# Patient Record
Sex: Female | Born: 1955 | Race: Black or African American | Hispanic: No | Marital: Married | Smoking: Former smoker
Health system: Southern US, Community
[De-identification: ages and names within clinical notes are randomized; demographics above are authoritative.]

---

## 2006-10-29 ENCOUNTER — Emergency Department: Payer: Self-pay | Admitting: Emergency Medicine

## 2007-02-20 ENCOUNTER — Encounter: Payer: Self-pay | Admitting: Orthopedic Surgery

## 2007-03-01 ENCOUNTER — Encounter: Payer: Self-pay | Admitting: Orthopedic Surgery

## 2007-12-16 ENCOUNTER — Ambulatory Visit: Payer: Self-pay | Admitting: Internal Medicine

## 2008-01-26 ENCOUNTER — Encounter: Payer: Self-pay | Admitting: Rheumatology

## 2008-01-29 ENCOUNTER — Encounter: Payer: Self-pay | Admitting: Rheumatology

## 2008-04-06 ENCOUNTER — Emergency Department: Payer: Self-pay | Admitting: Emergency Medicine

## 2008-06-14 ENCOUNTER — Ambulatory Visit: Payer: Self-pay | Admitting: Pain Medicine

## 2008-06-22 ENCOUNTER — Ambulatory Visit: Payer: Self-pay | Admitting: Pain Medicine

## 2008-06-27 ENCOUNTER — Ambulatory Visit: Payer: Self-pay | Admitting: Pain Medicine

## 2008-07-11 ENCOUNTER — Ambulatory Visit: Payer: Self-pay | Admitting: Neurology

## 2008-07-13 ENCOUNTER — Ambulatory Visit: Payer: Self-pay | Admitting: Pain Medicine

## 2008-08-09 ENCOUNTER — Ambulatory Visit: Payer: Self-pay | Admitting: Pain Medicine

## 2008-08-11 ENCOUNTER — Ambulatory Visit: Payer: Self-pay | Admitting: Neurology

## 2008-09-01 ENCOUNTER — Ambulatory Visit: Payer: Self-pay | Admitting: Pain Medicine

## 2008-11-09 ENCOUNTER — Ambulatory Visit: Payer: Self-pay | Admitting: Pain Medicine

## 2008-11-16 ENCOUNTER — Emergency Department: Payer: Self-pay | Admitting: Emergency Medicine

## 2008-12-08 ENCOUNTER — Ambulatory Visit: Payer: Self-pay | Admitting: Pain Medicine

## 2008-12-19 ENCOUNTER — Ambulatory Visit: Payer: Self-pay | Admitting: Internal Medicine

## 2009-01-10 ENCOUNTER — Ambulatory Visit: Payer: Self-pay | Admitting: Pain Medicine

## 2009-01-16 ENCOUNTER — Ambulatory Visit: Payer: Self-pay | Admitting: Pain Medicine

## 2009-02-16 ENCOUNTER — Ambulatory Visit: Payer: Self-pay | Admitting: Pain Medicine

## 2009-02-23 ENCOUNTER — Encounter: Payer: Self-pay | Admitting: Neurology

## 2009-03-01 ENCOUNTER — Encounter: Payer: Self-pay | Admitting: Neurology

## 2009-03-16 ENCOUNTER — Ambulatory Visit: Payer: Self-pay | Admitting: Pain Medicine

## 2009-03-27 ENCOUNTER — Ambulatory Visit: Payer: Self-pay | Admitting: Pain Medicine

## 2009-03-30 ENCOUNTER — Encounter: Payer: Self-pay | Admitting: Neurology

## 2009-04-19 ENCOUNTER — Ambulatory Visit: Payer: Self-pay | Admitting: Gastroenterology

## 2009-04-25 ENCOUNTER — Ambulatory Visit: Payer: Self-pay | Admitting: Pain Medicine

## 2009-05-17 ENCOUNTER — Inpatient Hospital Stay (HOSPITAL_COMMUNITY): Admission: RE | Admit: 2009-05-17 | Discharge: 2009-05-20 | Payer: Self-pay | Admitting: Neurosurgery

## 2009-05-25 ENCOUNTER — Ambulatory Visit: Payer: Self-pay | Admitting: Pain Medicine

## 2009-06-27 ENCOUNTER — Ambulatory Visit: Payer: Self-pay | Admitting: Pain Medicine

## 2009-08-03 ENCOUNTER — Ambulatory Visit: Payer: Self-pay | Admitting: Pain Medicine

## 2009-08-16 ENCOUNTER — Ambulatory Visit: Payer: Self-pay | Admitting: Pain Medicine

## 2009-09-12 ENCOUNTER — Ambulatory Visit: Payer: Self-pay | Admitting: Pediatric Dentistry

## 2009-10-19 ENCOUNTER — Ambulatory Visit: Payer: Self-pay | Admitting: Pain Medicine

## 2009-10-24 ENCOUNTER — Ambulatory Visit: Payer: Self-pay | Admitting: Unknown Physician Specialty

## 2009-11-06 ENCOUNTER — Ambulatory Visit: Payer: Self-pay | Admitting: Pain Medicine

## 2009-12-07 ENCOUNTER — Ambulatory Visit: Payer: Self-pay | Admitting: Pain Medicine

## 2009-12-18 ENCOUNTER — Ambulatory Visit: Payer: Self-pay | Admitting: Pain Medicine

## 2011-01-06 LAB — CBC
HCT: 37 % (ref 36.0–46.0)
MCV: 75.1 fL — ABNORMAL LOW (ref 78.0–100.0)
Platelets: 266 10*3/uL (ref 150–400)
RDW: 17.6 % — ABNORMAL HIGH (ref 11.5–15.5)

## 2011-01-06 LAB — BASIC METABOLIC PANEL
BUN: 10 mg/dL (ref 6–23)
CO2: 29 mEq/L (ref 19–32)
Chloride: 103 mEq/L (ref 96–112)
Glucose, Bld: 109 mg/dL — ABNORMAL HIGH (ref 70–99)
Potassium: 4.1 mEq/L (ref 3.5–5.1)

## 2011-02-12 NOTE — Op Note (Signed)
Virginia Osborn, PARSLEY NO.:  [id number]   MEDICAL RECORD NO.:  [id number]          PATIENT TYPE:  INP   LOCATION:  3036                         FACILITY:  [facility]   PHYSICIAN:  Hilda Lias, M.D.   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   PROCEDURES:  1. Anterior C3-C4, C4-C5, C5-C6 diskectomy, decompression of the      spinal cord, removal of posterior calcified ligament, foraminotomy,      and interbody fusion with autograft and allograft and plate.  2. Microscope.   SURGEON:  Hilda Lias, MD   ASSISTANT:  Dr. Lovell Sheehan.   CLINICAL HISTORY:  [name] is a lady who came to my office  complaining of neck pain with radiation to both upper extremities  associated with weakness.  The patient has failed conservative  treatment.  X-ray shows severe stenosis at the level of 3-4, 4-5, 5-6  secondary to calcified posterior ligament.  Surgery was advised, and the  risks were explained to her.   DESCRIPTION OF PROCEDURE:  The patient was taken to the OR, and after  intubation, the neck was cleaned with DuraPrep.  Transverse incision was  made through the skin and subcutaneous tissue, platysma down to the  cervical area.  We found that she had 2 large osteophytes.  X-rays  showed we were at the level of C3-C4.  From then on, with the Leksell we  removed the osteophyte.  To enter into the disk space, we had to drill  the anterior ligament.  The area between 3-4 was almost calcified.  Once  we removed the disk, we found that she had calcification to the  posterior ligament, mostly from the midline to the left side.  Using the  1 and 2 mm Kerrison punch as well  as the drill, we were able to  decompress the spinal cord and do a bilateral foraminotomy.  Essentially, the same findings were found at the level of 4-5, 5-6, with  decompression at the end of the spinal cord of the nerve root.  Then,  the end-plates were drilled, and 3 pieces of allograft with autograft  inside, BMX, were inserted.  The graft was 7 mm height and lordotic.  This was followed by plate using 8 screws.  Lateral cervical spine  showed that there was good position of the graft at the level of 3-4, 4-  5.  It was difficult to see 5-6.  Although we achieved good hemostasis,  we left a drain.  The wound was at the end closed with Vicryl and Steri-  Strips.           ______________________________  Hilda Lias, M.D.     EB/MEDQ  D:  05/17/2009  T:  05/18/2009  Job:  [id number]

## 2011-02-12 NOTE — Discharge Summary (Signed)
[name], [name] NO.:  [id number]   MEDICAL RECORD NO.:  [id number]          PATIENT TYPE:  INP   LOCATION:  3036                         FACILITY:  [facility]   PHYSICIAN:  Danae Orleans. Venetia Maxon, M.D.  DATE OF BIRTH:  [birth year]   DATE OF ADMISSION:  05/17/2009  DATE OF DISCHARGE:  05/20/2009                               DISCHARGE SUMMARY   REASON FOR ADMISSION:  Cervical spondylosis with stenosis and  ossification of posterior longitudinal ligament.   HOSPITAL COURSE:  The patient was admitted to the hospital with cervical  myelopathy.  She had bilateral upper extremity weakness, was admitted on  same day of procedure basis, and underwent anterior cervical  decompression and fusion C3-4, C4-5, and C5-6 levels.  She did well with  her surgery, was gradually mobilized, had some dysphagia which improved,  had a JP drain which was discontinued, and was up and ambulating, was  doing well on the May 20, 2009, and was given prescription for  Percocet 5/325 one every 4 hours as needed for pain, dispensed #16,  Valium 5 mg one or two every 6 hours as needed for spasm, dispensed #40.  She was instructed to follow up in the office with Dr. Jeral Fruit in 3  weeks.  She remained on her preoperative medications of gabapentin,  metronidazole, vitamin D, tramadol, clarithromycin, Darvocet, Nexium,  cyclobenzaprine, Cymbalta, Zyrtec, lisinopril, and iron.      Danae Orleans. Venetia Maxon, M.D.  Electronically Signed     JDS/MEDQ  D:  05/20/2009  T:  05/20/2009  Job:  [id number]

## 2012-09-09 ENCOUNTER — Emergency Department: Payer: Self-pay | Admitting: Internal Medicine

## 2012-09-29 ENCOUNTER — Ambulatory Visit: Payer: Self-pay | Admitting: Internal Medicine

## 2012-10-27 ENCOUNTER — Emergency Department: Payer: Self-pay | Admitting: Emergency Medicine

## 2012-10-27 LAB — CBC WITH DIFFERENTIAL/PLATELET
Basophil %: 0.9 %
Eosinophil #: 0.3 10*3/uL (ref 0.0–0.7)
HCT: 38.1 % (ref 35.0–47.0)
Lymphocyte #: 3.5 10*3/uL (ref 1.0–3.6)
Lymphocyte %: 51.9 %
MCHC: 31.9 g/dL — ABNORMAL LOW (ref 32.0–36.0)
MCV: 73 fL — ABNORMAL LOW (ref 80–100)
Monocyte #: 0.6 x10 3/mm (ref 0.2–0.9)
Monocyte %: 9.3 %
Platelet: 218 10*3/uL (ref 150–440)
RDW: 16.8 % — ABNORMAL HIGH (ref 11.5–14.5)
WBC: 6.7 10*3/uL (ref 3.6–11.0)

## 2012-10-27 LAB — COMPREHENSIVE METABOLIC PANEL
Albumin: 3.6 g/dL (ref 3.4–5.0)
Alkaline Phosphatase: 127 U/L (ref 50–136)
BUN: 10 mg/dL (ref 7–18)
Bilirubin,Total: 0.3 mg/dL (ref 0.2–1.0)
Calcium, Total: 8.9 mg/dL (ref 8.5–10.1)
Chloride: 105 mmol/L (ref 98–107)
Co2: 29 mmol/L (ref 21–32)
Creatinine: 0.98 mg/dL (ref 0.60–1.30)
EGFR (African American): >60
EGFR (Non-African Amer.): >60
Osmolality: 283 (ref 275–301)
SGPT (ALT): 31 U/L (ref 12–78)

## 2012-10-27 LAB — CK TOTAL AND CKMB (NOT AT ARMC)
CK, Total: 99 U/L (ref 21–215)
CK-MB: <0.5 ng/mL — ABNORMAL LOW (ref 0.5–3.6)

## 2012-10-30 ENCOUNTER — Emergency Department: Payer: Self-pay | Admitting: Emergency Medicine

## 2012-11-19 ENCOUNTER — Ambulatory Visit: Payer: Self-pay | Admitting: Internal Medicine

## 2014-06-28 ENCOUNTER — Other Ambulatory Visit: Payer: Self-pay | Admitting: Neurosurgery

## 2014-06-28 DIAGNOSIS — M5412 Radiculopathy, cervical region: Secondary | ICD-10-CM

## 2014-07-07 ENCOUNTER — Other Ambulatory Visit: Payer: Self-pay

## 2014-07-28 ENCOUNTER — Ambulatory Visit
Admission: RE | Admit: 2014-07-28 | Discharge: 2014-07-28 | Disposition: A | Discharge status: Home / Self Care | Payer: Medicaid Other | Source: Ambulatory Visit | Attending: Neurosurgery | Admitting: Neurosurgery

## 2014-07-28 VITALS — BP 147/54 | HR 55

## 2014-07-28 DIAGNOSIS — M5412 Radiculopathy, cervical region: Secondary | ICD-10-CM

## 2014-07-28 MED ORDER — DIAZEPAM 5 MG PO TABS
10.0000 mg | ORAL_TABLET | Freq: Once | ORAL | Status: AC
  Administered 2014-07-28: 10 mg via ORAL

## 2014-07-28 MED ORDER — ONDANSETRON 8 MG PO TBDP
8.0000 mg | ORAL_TABLET | Freq: Once | ORAL | Status: AC
  Administered 2014-07-28: 8 mg via ORAL

## 2014-07-28 MED ORDER — HYDROMORPHONE HCL 1 MG/ML IJ SOLN
1.0000 mg | Freq: Once | INTRAMUSCULAR | Status: AC
  Administered 2014-07-28: 1 mg via INTRAMUSCULAR

## 2014-07-28 MED ORDER — ONDANSETRON HCL 4 MG/2ML IJ SOLN
4.0000 mg | Freq: Once | INTRAMUSCULAR | Status: AC
  Administered 2014-07-28: 4 mg via INTRAMUSCULAR

## 2014-07-28 MED ORDER — IOHEXOL 300 MG/ML  SOLN
10.0000 mL | Freq: Once | INTRAMUSCULAR | Status: AC | PRN
  Administered 2014-07-28: 10 mL via INTRATHECAL

## 2014-07-28 NOTE — Progress Notes (Signed)
Pt had a syncopal spell when she went for her standing films. Returned via stretcher to nrsg area and vomited a very small amount of clear fluid. Pt has not eaten since last pm, gave her a few sips of coke. Pt tolerated this well. Will see if she is now able to stand for films.

## 2014-07-28 NOTE — Discharge Instructions (Signed)
Myelogram Discharge Instructions  1. Go home and rest quietly for the next 24 hours.  It is important to lie flat for the next 24 hours.  Get up only to go to the restroom.  You may lie in the bed or on a couch on your back, your stomach, your left side or your right side.  You may have one pillow under your head.  You may have pillows between your knees while you are on your side or under your knees while you are on your back.  2. DO NOT drive today.  Recline the seat as far back as it will go, while still wearing your seat belt, on the way home.  3. You may get up to go to the bathroom as needed.  You may sit up for 10 minutes to eat.  You may resume your normal diet and medications unless otherwise indicated.  Drink lots of extra fluids today and tomorrow.  4. The incidence of headache, nausea, or vomiting is about 5% (one in 20 patients).  If you develop a headache, lie flat and drink plenty of fluids until the headache goes away.  Caffeinated beverages may be helpful.  If you develop severe nausea and vomiting or a headache that does not go away with flat bed rest, call [phone].  5. You may resume normal activities after your 24 hours of bed rest is over; however, do not exert yourself strongly or do any heavy lifting tomorrow. If when you get up you have a headache when standing, go back to bed and force fluids for another 24 hours.  6. Call your physician for a follow-up appointment.  The results of your myelogram will be sent directly to your physician by the following day.  7. If you have any questions or if complications develop after you arrive home, please call [phone].  Discharge instructions have been explained to the patient.  The patient, or the person responsible for the patient, fully understands these instructions.      May resume Tramadol and Sumatriptan on Oct. 30, 2015, after 11:00 am.

## 2014-07-28 NOTE — Progress Notes (Signed)
Patient states she has been off Sumatriptan and Tramadol for at least the past two days.

## 2014-09-14 ENCOUNTER — Other Ambulatory Visit: Payer: Self-pay | Admitting: Neurosurgery

## 2014-09-14 DIAGNOSIS — M5136 Other intervertebral disc degeneration, lumbar region: Secondary | ICD-10-CM

## 2014-12-16 ENCOUNTER — Ambulatory Visit
Admission: RE | Admit: 2014-12-16 | Discharge: 2014-12-16 | Disposition: A | Discharge status: Home / Self Care | Payer: Medicaid Other | Source: Ambulatory Visit | Attending: Neurosurgery | Admitting: Neurosurgery

## 2014-12-16 DIAGNOSIS — M5136 Other intervertebral disc degeneration, lumbar region: Secondary | ICD-10-CM

## 2014-12-16 MED ORDER — METHYLPREDNISOLONE ACETATE 40 MG/ML INJ SUSP (RADIOLOG
120.0000 mg | Freq: Once | INTRAMUSCULAR | Status: AC
  Administered 2014-12-16: 120 mg via EPIDURAL

## 2014-12-16 MED ORDER — IOHEXOL 180 MG/ML  SOLN
1.0000 mL | Freq: Once | INTRAMUSCULAR | Status: AC | PRN
  Administered 2014-12-16: 1 mL via EPIDURAL

## 2014-12-16 NOTE — Discharge Instructions (Signed)

## 2015-05-02 ENCOUNTER — Other Ambulatory Visit: Payer: Self-pay | Admitting: Internal Medicine

## 2015-05-02 DIAGNOSIS — Z1231 Encounter for screening mammogram for malignant neoplasm of breast: Secondary | ICD-10-CM

## 2015-05-11 ENCOUNTER — Ambulatory Visit
Admission: RE | Admit: 2015-05-11 | Discharge: 2015-05-11 | Disposition: A | Discharge status: Home / Self Care | Payer: Medicaid Other | Source: Ambulatory Visit | Attending: Internal Medicine | Admitting: Internal Medicine

## 2015-05-11 DIAGNOSIS — Z1231 Encounter for screening mammogram for malignant neoplasm of breast: Secondary | ICD-10-CM | POA: Diagnosis not present

## 2015-07-18 ENCOUNTER — Encounter: Payer: Self-pay | Admitting: Physical Therapy

## 2015-07-18 ENCOUNTER — Ambulatory Visit: Payer: Medicaid Other | Attending: Internal Medicine | Admitting: Physical Therapy

## 2015-07-18 DIAGNOSIS — M542 Cervicalgia: Secondary | ICD-10-CM | POA: Diagnosis not present

## 2015-07-18 NOTE — Addendum Note (Signed)
Addended by: Ezekiel InaMANSFIELD, Jasim Harari S on: 07/18/2015 12:03 PM   Modules accepted: Orders

## 2015-07-18 NOTE — Therapy (Addendum)
[facility] [address] [place], [place], [address] Phone: [phone]   Fax:  [phone]  Physical Therapy Evaluation  Patient Details  Name: Virginia Osborn MRN: [id number] Date of Birth: 1956/05/31 Referring Provider: Ellsworth Lennoxejan-Sie  Encounter Date: 07/18/2015    No past medical history on file.  No past surgical history on file.  There were no vitals filed for this visit.  Visit Diagnosis:  No diagnosis found.      Subjective Assessment - 07/18/15 1105    Subjective pt stated if she turned her head to the left an uncomfortable soreness that would shoot up to her head on both sides; surgery on right side in 2010   Currently in Pain? Yes   Pain Score 8    Pain Location Neck   Pain Descriptors / Indicators Sore            [facility] PT Assessment - 07/18/15 0001    Assessment   Medical Diagnosis cervicalgia   Referring Provider Tejan-Sie   Onset Date/Surgical Date 09/30/08   Hand Dominance Right   Next MD Visit 09/13/2015   Prior Therapy Yes   Balance Screen   Has the patient fallen in the past 6 months No   Has the patient had a decrease in activity level because of a fear of falling?  Yes   Is the patient reluctant to leave their home because of a fear of falling?  Yes  problem when driving   Home Nurse, mental healthnvironment   Living Environment Private residence   Living Arrangements Spouse/significant other;Children   Available Help at Discharge Family   Type of Home House   Prior Function   Level of Independence Independent       PAIN: 8/10 soreness through shoulders and neck     PROM/AROM: Neck flex: 35 Neck ext: 20 Neck rot right: 45 Neck rot left: 30  STRENGTH:  Graded on a 0-5 scale Muscle Group Left Right  Shoulder flex 5 5  Shoulder Abd 5 5  Shoulder Ext 5 5  Shoulder IR/ER    Elbow    Wrist/hand    Hip Flex    Hip Abd    Hip Add    Hip Ext    Hip IR/ER    Knee Flex    Knee Ext    Ankle  DF    Ankle PF    Neck flex: 4/5 Neck ext:4/5 Neck left:4/5 Neck right: 4/5  SENSATION: Within normal limits    Patient was  Informed that there is a free clinic that offers treatment called [facility] and she declined.                                                                           Problem List There are no active problems to display for this patient.   Ezekiel InaMansfield, Giulliana Mcroberts S 07/18/2015, 11:12 AM  [facility] [address] [place], [place], [address] Phone: [phone]   Fax:  [phone]  Name: Virginia Osborn MRN: [id number] Date of Birth: 1956/05/31

## 2015-07-18 NOTE — Therapy (Addendum)
[facility] [address] [place], [place], [address] Phone: [phone]   Fax:  [phone]  Physical Therapy Evaluation  Patient Details  Name: Virginia Osborn MRN: [id number] Date of Birth: 06-25-1956 Referring Provider: Ellsworth Lennoxejan-Sie  Encounter Date: 07/18/2015      PT End of Session - 07/18/15 1142    Visit Number 1   Number of Visits 1   Activity Tolerance Patient tolerated treatment well   Behavior During Therapy [facility] for tasks assessed/performed      No past medical history on file.  No past surgical history on file.  There were no vitals filed for this visit.  Visit Diagnosis:  Neck pain - Plan: PT plan of care cert/re-cert      Subjective Assessment - 07/18/15 1105    Subjective pt stated if she turned her head to the left an uncomfortable soreness that would shoot up to her head on both sides; surgery on right side in 2010   Currently in Pain? Yes   Pain Score 8    Pain Location Neck   Pain Descriptors / Indicators Sore            [facility] PT Assessment - 07/18/15 0001    Assessment   Medical Diagnosis cervicalgia   Referring Provider Tejan-Sie   Onset Date/Surgical Date 09/30/08   Hand Dominance Right   Next MD Visit 09/13/2015   Prior Therapy Yes   Balance Screen   Has the patient fallen in the past 6 months No   Has the patient had a decrease in activity level because of a fear of falling?  Yes   Is the patient reluctant to leave their home because of a fear of falling?  Yes  problem when driving   Home Environment   Living Environment Private residence   Living Arrangements Spouse/significant other;Children   Available Help at Discharge Family   Type of Home House   Prior Function   Level of Independence Independent                           PT Education - 07/18/15 1141    Education provided Yes   Education Details insurance coverage of eval only   Person(s)  Educated Patient   Methods Explanation   Comprehension Verbalized understanding             PT Long Term Goals - 07/18/15 1145    PT LONG TERM GOAL #1   Title Patient will be awaare of her insurance coverage for evaluation only for this cervical pain diagones.    Status Achieved               Plan - 07/18/15 1142    Clinical Impression Statement Patient is 59 yr old female with reports of neck pain. She has pain with neck movement . She has decreased ROM to her neck and strength is WFL BUE and neck.    Pt will benefit from skilled therapeutic intervention in order to improve on the following deficits Pain;Decreased range of motion   Rehab Potential Good   PT Frequency One time visit   Consulted and Agree with Plan of Care Patient         Problem List There are no active problems to display for this patient.   Jones BroomMansfield, Katalena Malveaux S 07/18/2015, 12:00 PM  [facility] [facility] [address] [place], [address] Phone: [phone]   Fax:  [phone]  Name: Virginia Osborn MRN: [id number] Date of Birth: 04-05-1956

## 2015-07-24 ENCOUNTER — Encounter: Payer: Medicaid Other | Admitting: Physical Therapy

## 2015-07-26 ENCOUNTER — Encounter: Payer: Medicaid Other | Admitting: Physical Therapy

## 2015-07-31 ENCOUNTER — Encounter: Payer: Medicaid Other | Admitting: Physical Therapy

## 2015-08-02 ENCOUNTER — Encounter: Payer: Medicaid Other | Admitting: Physical Therapy

## 2016-01-08 IMAGING — CT CT L SPINE W/ CM
4 of 11 series · 12 of 33 positions shown, 14 images · non-contrast
Comparison: none

ADDENDUM:
We were able to obtain flexion and extension views on delayed basis.
The patient was unable to stand initially for the radiographs.
Although flexion and extension are suboptimal, there is no
pathologic instability on flexion and extension.
CLINICAL DATA: Cervical radiculopathy. Prior cervical fusion.
Lumbar radiculopathy.
TECHNIQUE: Contiguous axial images were obtained through the Cervical and
Lumbar spine after the intrathecal infusion of infusion. Coronal and
sagittal reconstructions were obtained of the axial image sets.

[Series 3: l spine bone · axial · 0.27mm/px · z∈[-300,-67]mm · 3 of 94 slices shown, 4 images]
[im 1/94  soft-tissue]
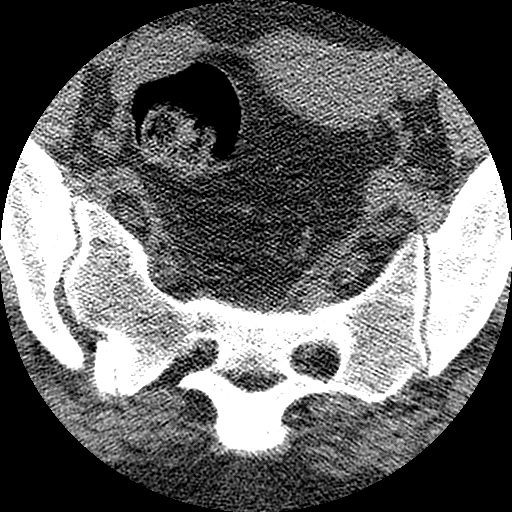
[im 1/94  bone]
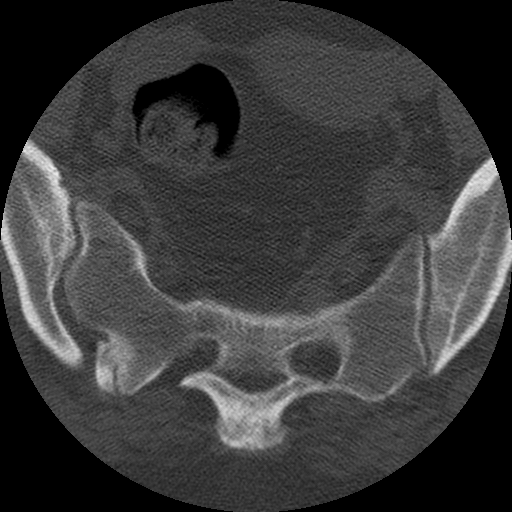
[im 47/94  bone]
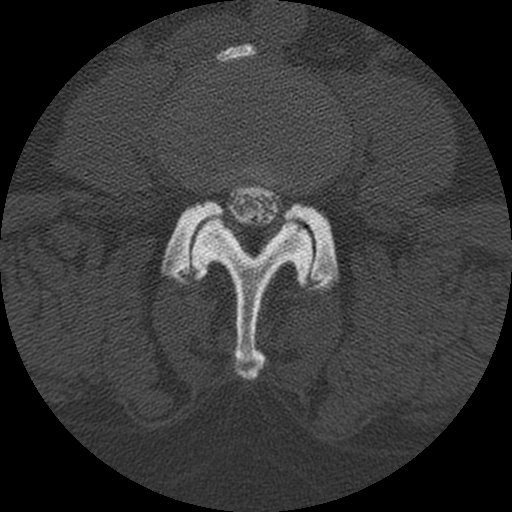
[im 94/94  bone]
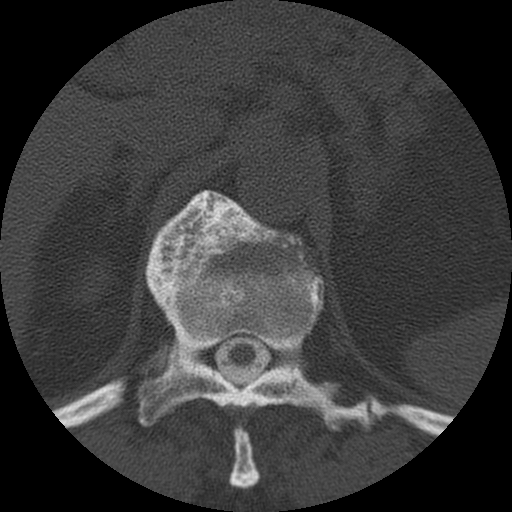

[Series 4: l spine soft · axial · 0.27mm/px · z∈[-222,-144]mm · 2 of 94 slices shown]
[im 32/94  soft-tissue]
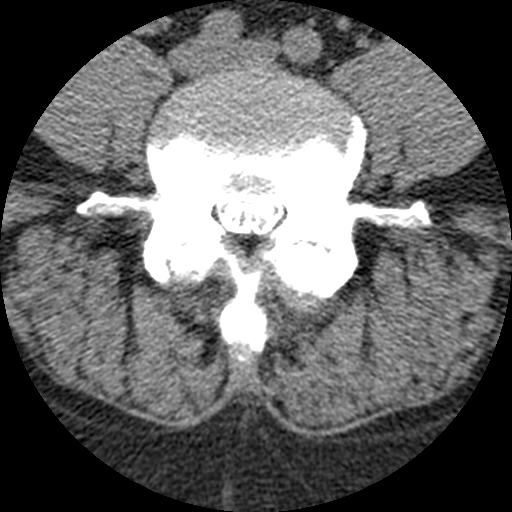
[im 63/94  soft-tissue]
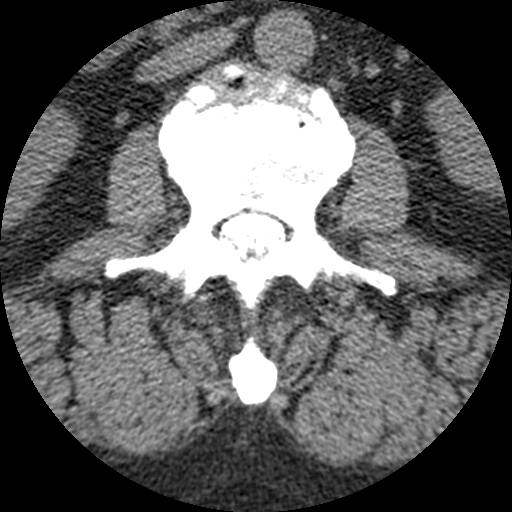

[Series 500: cor upper · coronal · 0.46mm/px · 2 of 50 slices shown]
[im 23/50  bone]
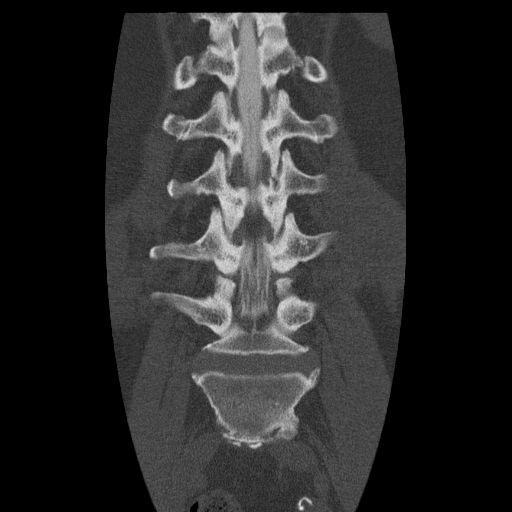
[im 46/50  bone]
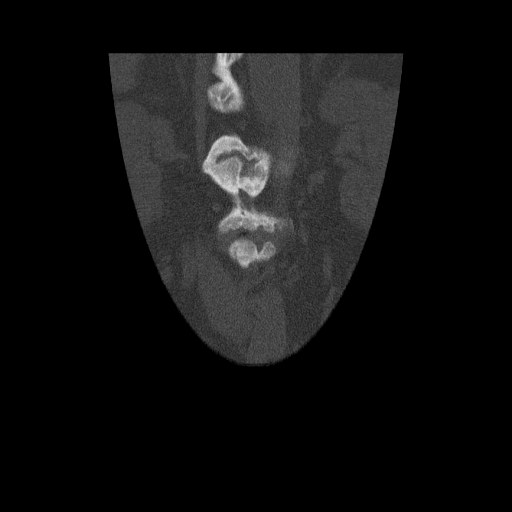

[Series 502: sag · sagittal · 0.46mm/px · 5 of 61 slices shown, 6 images]
[im 21/61  bone]
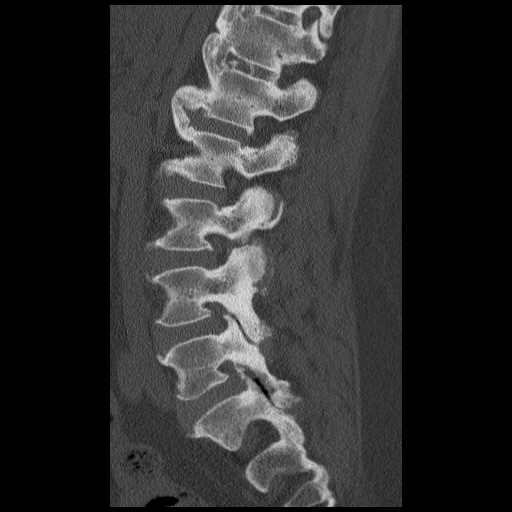
[im 26/61  bone]
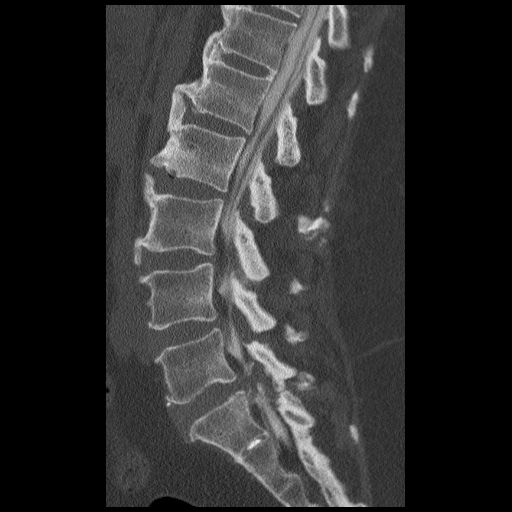
[im 31/61  soft-tissue]
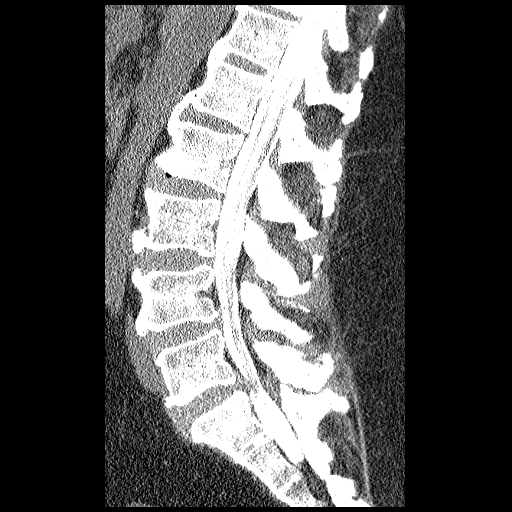
[im 31/61  bone]
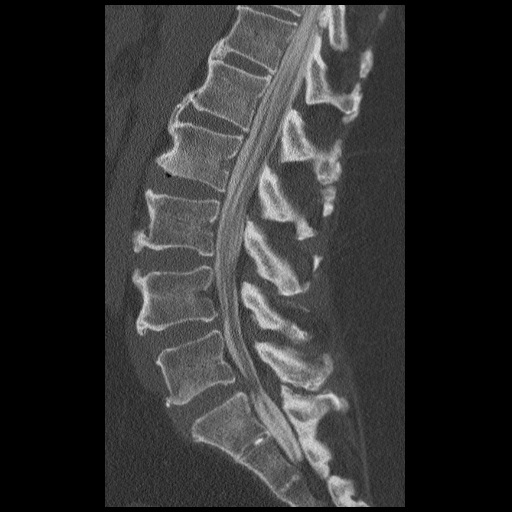
[im 36/61  bone]
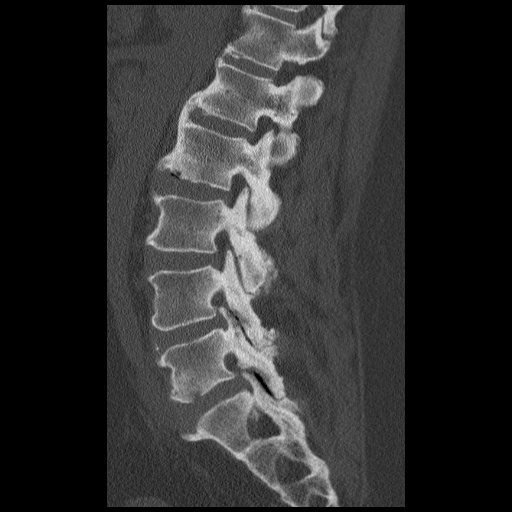
[im 41/61  bone]
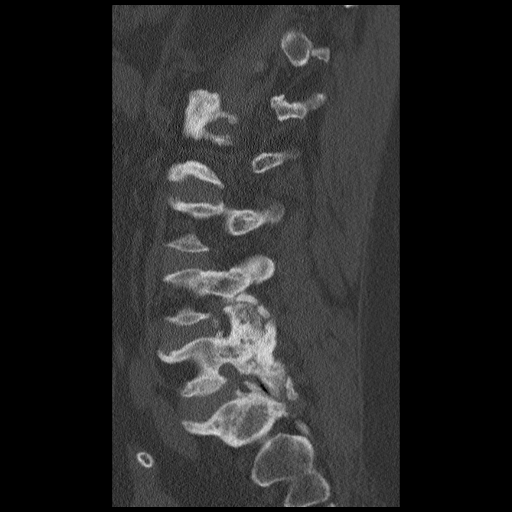

[12 of 33 positions shown; findings below may reference images not displayed]

FLUOROSCOPY TIME:  2 min 16 seconds

PROCEDURE:
LUMBAR PUNCTURE FOR CERVICAL AND LUMBAR MYELOGRAM

CERVICAL AND LUMBAR MYELOGRAM

CT CERVICAL MYELOGRAM

CT LUMBAR MYELOGRAM

After thorough discussion of risks and benefits of the procedure
including bleeding, infection, injury to nerves, blood vessels,
adjacent structures as well as headache and CSF leak, written and
oral informed consent was obtained. Consent was obtained by Dr.
Reyley Iskandhar.

Patient was positioned prone on the fluoroscopy table. Local
anesthesia was provided with 1% lidocaine without epinephrine after
prepped and draped in the usual sterile fashion. Puncture was
performed at L2-L3 using a 5 inch 22-gauge spinal needle via RIGHT
paramedian approach. Using a single pass through the dura, the
needle was placed within the thecal sac, with return of clear CSF.
10 mL of 2mnipaque-DOO was injected into the thecal sac, with normal
opacification of the nerve roots and cauda equina consistent with
free flow within the subarachnoid space. The patient was then moved
to the trendelenburg position and contrast flowed into the Cervical
spine region.

I personally performed the lumbar puncture and administered the
intrathecal contrast. I also personally supervised acquisition of
the myelogram images.
FINDINGS: CERVICAL AND LUMBAR MYELOGRAM FINDINGS:

ACDF plate is present from C3 through C7. There is mild adjacent
segment disease at C2-C3. The lower cervical spine is obscured by
overlapping soft tissues due to obese body habitus. There is no
pathologic motion with flexion and extension. The alignment appears
within normal limits in the visible cervical spine.

Standing upright lumbar radiographs are suboptimal because of body
habitus and combined myelogram technique. On the supine images,
there does appear to be some stenosis at L5-S1 with transverse
narrowing of the thecal sac and probable bilateral lateral recess
stenosis.

CT CERVICAL MYELOGRAM FINDINGS:

Alignment: Anatomic.

Vertebrae: No destructive osseous lesions. Solid fusion from C3
through C6. Negative for fracture.

Cord: Normal caliber. No intramedullary lesions. Congenital
narrowing of the central canal.

Posterior Fossa: Grossly normal.

Vertebral Arteries: Grossly normal.

Paraspinal tissues: Lung apices appear normal.

Disc levels:

Craniocervical junction appears within normal limits. Atlantodental
degenerative disease. Ossification of the posterior longitudinal
ligament is present with osseous ridging extending from C3-C4
through the upper thoracic spine.

C2-C3: Disc degeneration with central disc protrusion producing mild
central stenosis. The protrusion contacts the ventral cord. The LEFT
neural foramen appears patent. There is RIGHT uncovertebral spurring
producing RIGHT foraminal stenosis that potentially affects the
RIGHT C3 nerve. Posterior ligamentum flavum calcification an
redundancy noted contributing to the mild central stenosis.

C3-C4:  Solid fusion.  No recurrent stenosis.

C4-C5: Solid fusion. Mild residual/ recurrent LEFT foraminal
stenosis. Mild congenital central stenosis. Posterior osseous
ridging is present centrally contributing to mild central stenosis.

C5-C6: Solid fusion. Mild central stenosis associated with osseous
ridging. Neural foramina appear adequately patent.

C6-C7: At this level, the exam is technically degraded by body
habitus and beam attenuation. There is RIGHT-greater-than-LEFT
foraminal stenosis secondary to uncovertebral spurring. This
potentially affects the RIGHT C7 nerve. Central canal appears mildly
narrowed with some posterior ligamentum flavum redundancy and a
small calcified central disc protrusion along with osseous ridging.

C7-T1: Disc degeneration. Possible RIGHT paracentral protrusion
contacting the ventral cord. Mild bilateral foraminal stenosis
associated with facet arthrosis.

CT LUMBAR MYELOGRAM FINDINGS:

Segmentation: 5 non-rib-bearing lumbar type vertebral bodies.

Alignment: Anatomic.

Vertebrae: No destructive osseous lesions. Thoracolumbar DISH is
present with confluent ossification from T11 through L2.

Conus medullaris:  Normal termination at L 2.

Paraspinal tissues: Severe sacroiliac joint osteoarthritis with
partial ankylosis bilaterally.

Disc levels:

L1-L2: Mild facet arthrosis.  No stenosis.

L2-L3: Disc degeneration mild facet arthrosis. No stenosis. Small
vacuum disc.

L3-L4:  Bilateral facet arthrosis.  No stenosis.

L4-L5: Mild central stenosis is multifactorial. Mild disc
degeneration with anterior annular calcification. Circumferential
disc bulging. There is moderate bilateral foraminal stenosis which
is multifactorial. Short pedicles are present. Severe bilateral
facet arthrosis is present, greater on the LEFT than RIGHT. There is
near complete effacement of fat around both L4 nerves. The lateral
recesses appear patent.

L5-S1: Moderate disc degeneration. Severe bilateral facet arthrosis
with bilateral vacuum joint. Moderate multifactorial central
stenosis. There is a calcified central disc protrusion. Severe
bilateral foraminal stenosis associated with short pedicles,
endplate spurring and facet arthrosis.
IMPRESSION: 1. Technically successful L2-L3 lumbar puncture for cervical and
lumbar myelogram.
2. Solid fusion from C3 through C6.
3. Congenitally narrow spinal canal with superimposed degenerative
disease. Ossification of the posterior longitudinal ligament in the
cervical spine.
4. Degenerative disease is mild at C2-C3 and more prominent at C6-C7
RIGHT C6-C7 foraminal stenosis potentially affecting the RIGHT C7
nerve.
5. Technically degraded study inferior to C6 due to body habitus.
Suspect mild central stenosis at C7-T1 associated with central
osseous ridging.
6. L5-S1 severe bilateral foraminal stenosis and moderate central
stenosis. Severe bilateral facet arthrosis, a calcified central disc
protrusion, short pedicles and endplate spurring produce the
bilateral foraminal stenosis.
7. L4-L5 mild central stenosis with severe bilateral foraminal
stenosis due to a combination of short pedicles, disc bulging and
facet arthrosis with anterior osteophytes.
